# Patient Record
Sex: Female | Born: 1985 | Race: Black or African American | Hispanic: No | Marital: Single | Smoking: Never smoker
Health system: Southern US, Community
[De-identification: ages and names within clinical notes are randomized; demographics above are authoritative.]

---

## 2015-07-19 ENCOUNTER — Encounter (HOSPITAL_COMMUNITY): Payer: Self-pay | Admitting: *Deleted

## 2015-07-19 ENCOUNTER — Emergency Department (HOSPITAL_COMMUNITY): Payer: Medicaid - Out of State

## 2015-07-19 ENCOUNTER — Emergency Department (HOSPITAL_COMMUNITY)
Admission: EM | Admit: 2015-07-19 | Discharge: 2015-07-19 | Disposition: A | Discharge status: Home / Self Care | Payer: Medicaid - Out of State | Attending: Emergency Medicine | Admitting: Emergency Medicine

## 2015-07-19 DIAGNOSIS — Y998 Other external cause status: Secondary | ICD-10-CM | POA: Insufficient documentation

## 2015-07-19 DIAGNOSIS — Y9289 Other specified places as the place of occurrence of the external cause: Secondary | ICD-10-CM | POA: Insufficient documentation

## 2015-07-19 DIAGNOSIS — S93402A Sprain of unspecified ligament of left ankle, initial encounter: Secondary | ICD-10-CM

## 2015-07-19 DIAGNOSIS — X58XXXA Exposure to other specified factors, initial encounter: Secondary | ICD-10-CM | POA: Diagnosis not present

## 2015-07-19 DIAGNOSIS — S99912A Unspecified injury of left ankle, initial encounter: Secondary | ICD-10-CM | POA: Diagnosis present

## 2015-07-19 DIAGNOSIS — Y9389 Activity, other specified: Secondary | ICD-10-CM | POA: Insufficient documentation

## 2015-07-19 MED ORDER — IBUPROFEN 400 MG PO TABS
600.0000 mg | ORAL_TABLET | Freq: Once | ORAL | Status: AC
  Administered 2015-07-19: 600 mg via ORAL
  Filled 2015-07-19: qty 2

## 2015-07-19 NOTE — ED Notes (Signed)
NAD at this time. Pt is stable and leaving with her sister. 

## 2015-07-19 NOTE — Discharge Instructions (Signed)
Ankle Sprain °An ankle sprain is an injury to the strong, fibrous tissues (ligaments) that hold the bones of your ankle joint together.  °CAUSES °An ankle sprain is usually caused by a fall or by twisting your ankle. Ankle sprains most commonly occur when you step on the outer edge of your foot, and your ankle turns inward. People who participate in sports are more prone to these types of injuries.  °SYMPTOMS  °· Pain in your ankle. The pain may be present at rest or only when you are trying to stand or walk. °· Swelling. °· Bruising. Bruising may develop immediately or within 1 to 2 days after your injury. °· Difficulty standing or walking, particularly when turning corners or changing directions. °DIAGNOSIS  °Your caregiver will ask you details about your injury and perform a physical exam of your ankle to determine if you have an ankle sprain. During the physical exam, your caregiver will press on and apply pressure to specific areas of your foot and ankle. Your caregiver will try to move your ankle in certain ways. An X-ray exam may be done to be sure a bone was not broken or a ligament did not separate from one of the bones in your ankle (avulsion fracture).  °TREATMENT  °Certain types of braces can help stabilize your ankle. Your caregiver can make a recommendation for this. Your caregiver may recommend the use of medicine for pain. If your sprain is severe, your caregiver may refer you to a surgeon who helps to restore function to parts of your skeletal system (orthopedist) or a physical therapist. °HOME CARE INSTRUCTIONS  °· Apply ice to your injury for 1-2 days or as directed by your caregiver. Applying ice helps to reduce inflammation and pain. °¨ Put ice in a plastic bag. °¨ Place a towel between your skin and the bag. °¨ Leave the ice on for 15-20 minutes at a time, every 2 hours while you are awake. °· Only take over-the-counter or prescription medicines for pain, discomfort, or fever as directed by  your caregiver. °· Elevate your injured ankle above the level of your heart as much as possible for 2-3 days. °· If your caregiver recommends crutches, use them as instructed. Gradually put weight on the affected ankle. Continue to use crutches or a cane until you can walk without feeling pain in your ankle. °· If you have a plaster splint, wear the splint as directed by your caregiver. Do not rest it on anything harder than a pillow for the first 24 hours. Do not put weight on it. Do not get it wet. You may take it off to take a shower or bath. °· You may have been given an elastic bandage to wear around your ankle to provide support. If the elastic bandage is too tight (you have numbness or tingling in your foot or your foot becomes cold and blue), adjust the bandage to make it comfortable. °· If you have an air splint, you may blow more air into it or let air out to make it more comfortable. You may take your splint off at night and before taking a shower or bath. Wiggle your toes in the splint several times per day to decrease swelling. °SEEK MEDICAL CARE IF:  °· You have rapidly increasing bruising or swelling. °· Your toes feel extremely cold or you lose feeling in your foot. °· Your pain is not relieved with medicine. °SEEK IMMEDIATE MEDICAL CARE IF: °· Your toes are numb or blue. °·   You have severe pain that is increasing. MAKE SURE YOU:   Understand these instructions.  Will watch your condition.  Will get help right away if you are not doing well or get worse. Document Released: 10/14/2005 Document Revised: 07/08/2012 Document Reviewed: 10/26/2011 [facility] Patient Information 2015 [place], [place]. This information is not intended to replace advice given to you by your health care provider. Make sure you discuss any questions you have with your health care provider.  Please use ibuprofen or Tylenol as needed for pain. Please follow-up with her primary care provider for reevaluation.

## 2015-07-19 NOTE — ED Notes (Addendum)
Pt reports "twisting" her ankle this morning. Pt noted to have swelling to left ankle.

## 2015-07-19 NOTE — ED Provider Notes (Signed)
CSN: [id number]     Arrival date & time 07/19/15  1048 History  This chart was scribed for non-physician practitioner, Eyvonne Mechanic, PA-C working with Melene Plan, DO by Gwenyth Ober, ED scribe. This patient was seen in room TR05C/TR05C and the patient's care was started at 11:21 AM   Chief Complaint  Patient presents with  . Ankle Pain   The history is provided by the patient. No language interpreter was used.   HPI Comments: Amber Hatfield is a 29 y.o. female who presents to the Emergency Department complaining of constant, moderate left ankle pain that started this morning. Pt states swelling of her ankle and left foot pain as associated symptoms. She is unable to ambulate secondary to pain. Pt reports that pain started after she stepped off of her sister's porch. She previously rolled her left ankle 10 years ago. Pt denies numbness.   History reviewed. No pertinent past medical history. History reviewed. No pertinent past surgical history. No family history on file. Social History  Substance Use Topics  . Smoking status: Never Smoker   . Smokeless tobacco: None  . Alcohol Use: None   OB History    No data available     Review of Systems  All other systems reviewed and are negative.  Allergies  Review of patient's allergies indicates no known allergies.  Home Medications   Prior to Admission medications   Not on File   BP 107/89 mmHg  Pulse 68  Temp(Src) 98 F (36.7 C) (Oral)  Resp 16  SpO2 100%  LMP 07/17/2015   Physical Exam  Constitutional: She appears well-developed and well-nourished. No distress.  HENT:  Head: Normocephalic and atraumatic.  Eyes: Conjunctivae and EOM are normal.  Neck: Neck supple. No tracheal deviation present.  Cardiovascular: Normal rate.   Pulmonary/Chest: Effort normal. No respiratory distress.  Musculoskeletal: She exhibits tenderness.  Left ankle with obvious swelling to lateral malleolus, TTP TTP of proximal foot Difficult  exam due to pt's pain Unable to ROM Pt is unable to ambulate Minimal TTP to distal tibia and fibula No obvious swelling of LE Proximal fibula non-TTP  Skin: Skin is warm and dry.  Psychiatric: She has a normal mood and affect. Her behavior is normal.  Nursing note and vitals reviewed.   ED Course  Procedures   DIAGNOSTIC STUDIES: Oxygen Saturation is 100% on RA, normal by my interpretation.    COORDINATION OF CARE: 11:27 AM Discussed treatment plan with pt at bedside and pt agreed to plan.   Labs Review Labs Reviewed - No data to display  Imaging Review Dg Ankle Complete Left  07/19/2015   CLINICAL DATA:  Twisted ankle yesterday, pain  EXAM: LEFT ANKLE COMPLETE - 3+ VIEW  COMPARISON:  None.  FINDINGS: There is no evidence of fracture, dislocation, or joint effusion. There is no evidence of arthropathy or other focal bone abnormality. Soft tissue swelling over the lateral malleolus.  IMPRESSION: No acute osseous injury of the left ankle.   Electronically Signed   By: Elige Ko   On: 07/19/2015 11:21   Dg Foot Complete Left  07/19/2015   CLINICAL DATA:  PAIN LEFT HEEL AND ACROSS METATARSALS OF LEFT FOOT  EXAM: LEFT FOOT - COMPLETE 3+ VIEW  COMPARISON:  None.  FINDINGS: There is no evidence of fracture or dislocation. There is no evidence of arthropathy or other focal bone abnormality. Soft tissues are unremarkable.  IMPRESSION: Negative.   Electronically Signed   By: Edgar Frisk.D.  On: 07/19/2015 12:37   I have personally reviewed and evaluated these images and lab results as part of my medical decision-making.   EKG Interpretation None      MDM   Final diagnoses:  Ankle sprain, left, initial encounter   Labs:    Imaging: X-ray left ankle and left foot  Consults:   Therapeutics: Ibuprofen, ice  Discharge Meds:   Assessment/Plan: Likely ankle sprain. Difficult to assess laxity due to patients pain. Will provide crutches and brace. PCP follow-up in 1 week  for further assessment. Pt verbalized agreement to plan.   I personally performed the services described in this documentation, which was scribed in my presence. The recorded information has been reviewed and is accurate.    Eyvonne Mechanic, PA-C 07/20/15 1812  Melene Plan, DO 07/21/15 1191

## 2016-12-09 IMAGING — DX DG FOOT COMPLETE 3+V*L*
3 series · 3 of 3 positions shown · non-contrast
Comparison: None.

CLINICAL DATA: PAIN LEFT HEEL AND ACROSS METATARSALS OF LEFT FOOT

EXAM:
LEFT FOOT - COMPLETE 3+ VIEW

[x foot ap left]
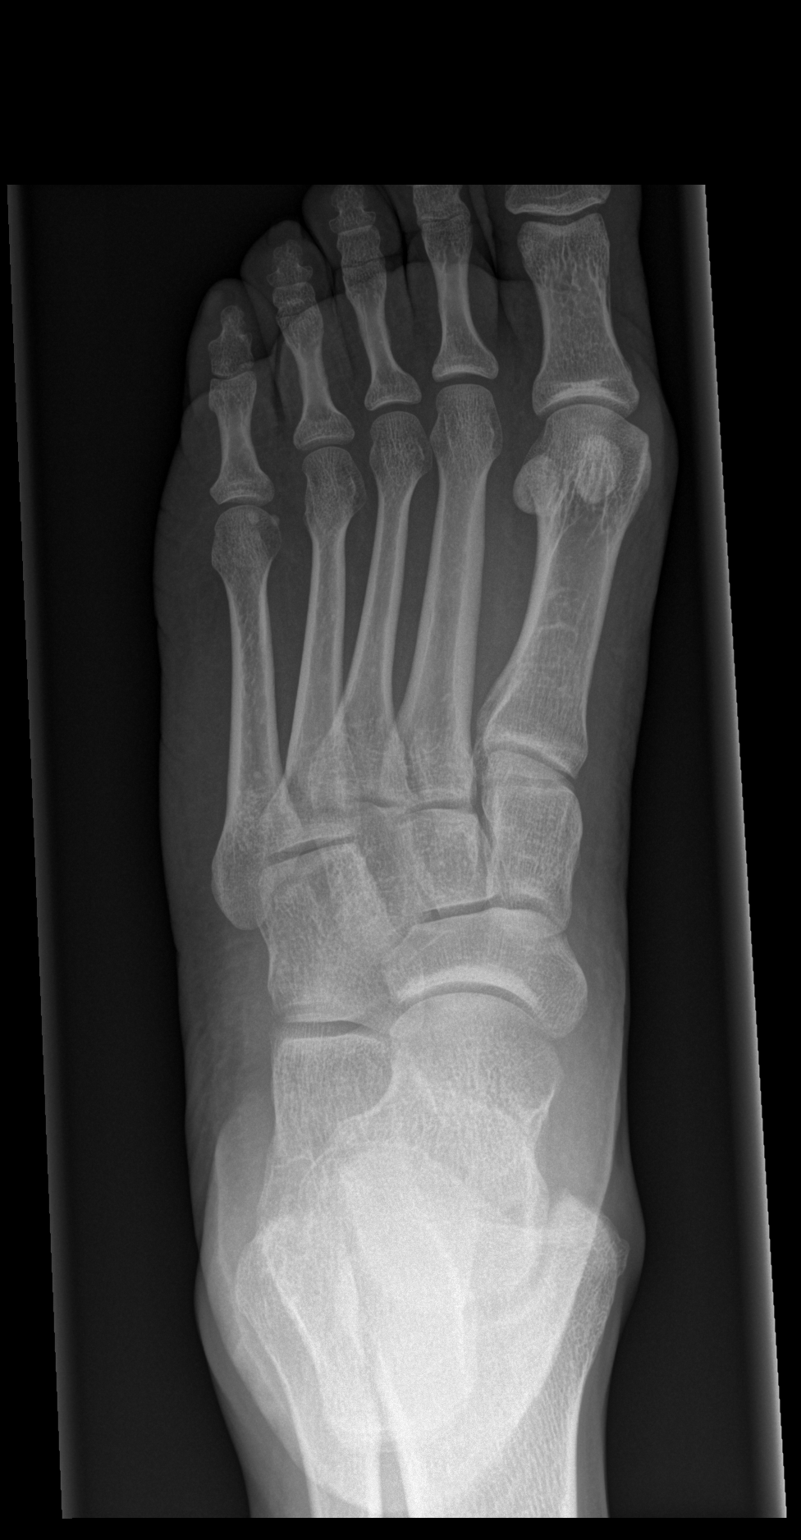

[x foot obl left]
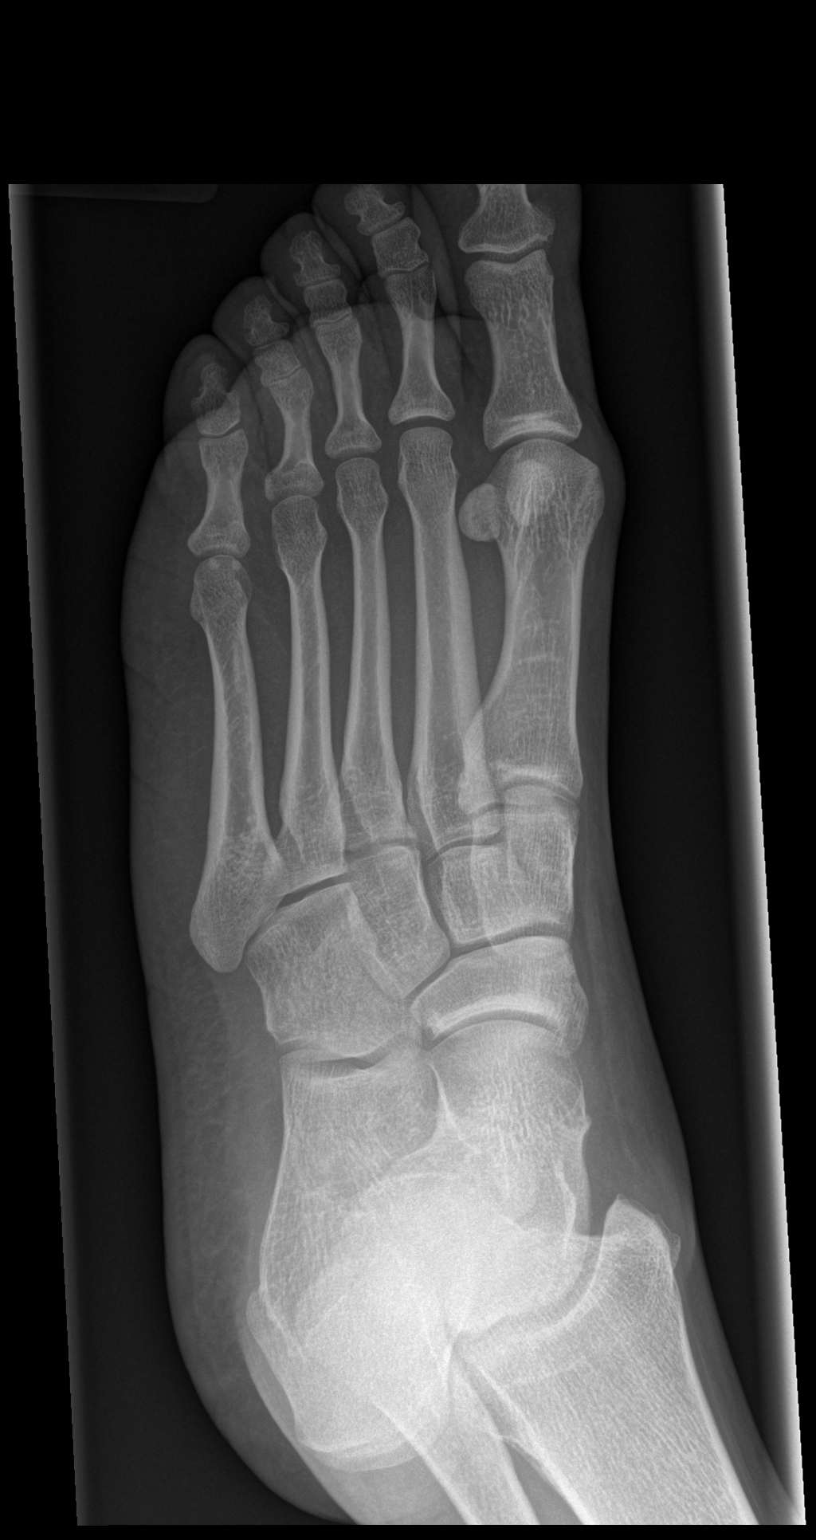

[x foot lat left]
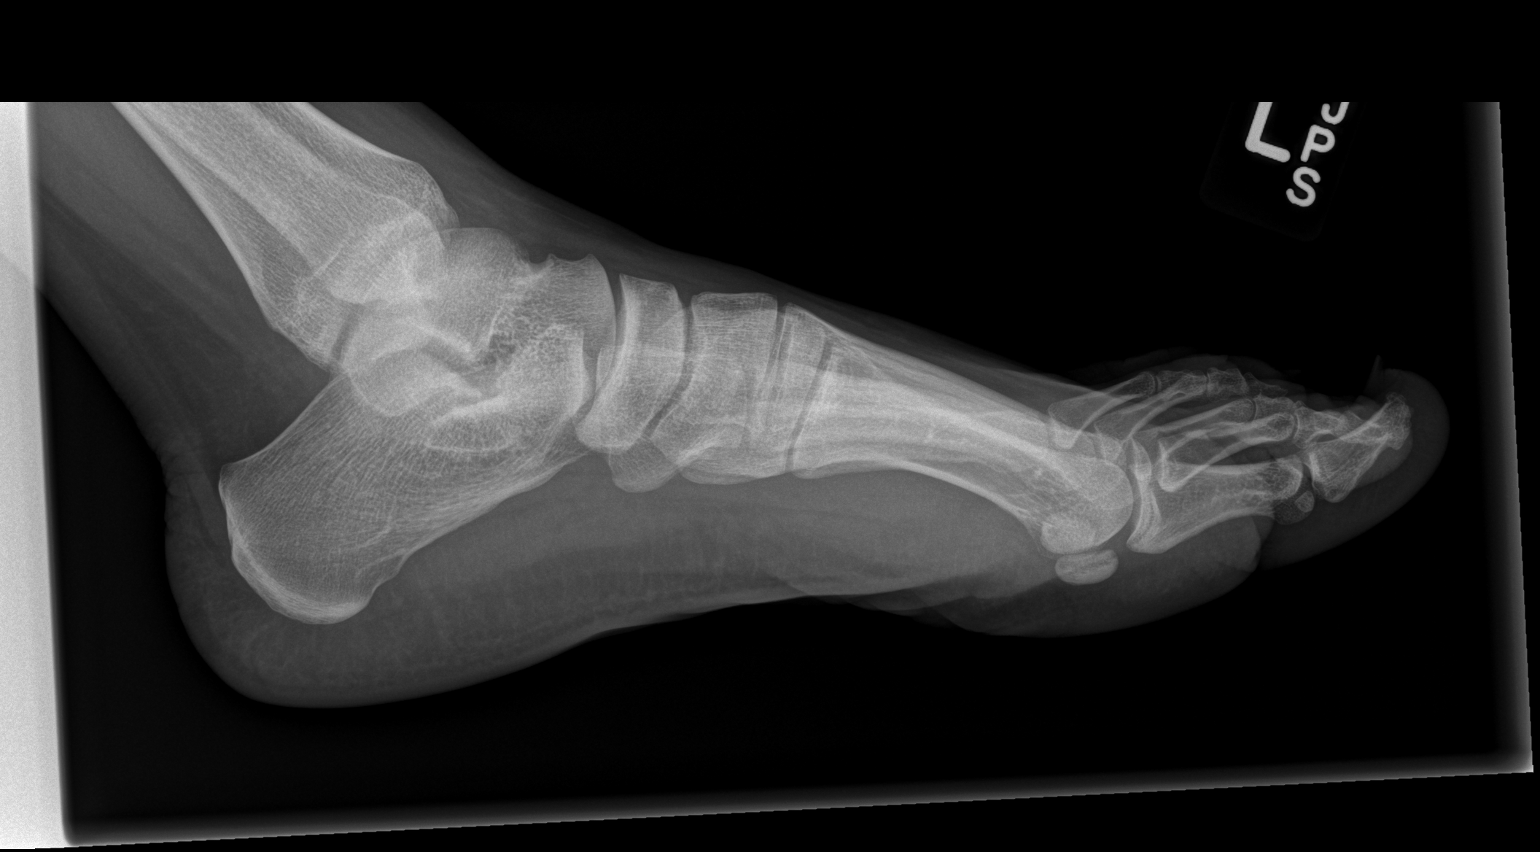

[3 of 3 positions shown; findings below may reference images not displayed]

FINDINGS: There is no evidence of fracture or dislocation. There is no
evidence of arthropathy or other focal bone abnormality. Soft
tissues are unremarkable.
IMPRESSION: Negative.
